# Patient Record
Sex: Male | Born: 1960 | Race: Black or African American | Hispanic: No | Smoking: Smoker, current status unknown
Health system: Southern US, Community
[De-identification: ages and names within clinical notes are randomized; demographics above are authoritative.]

## PROBLEM LIST (undated history)

## (undated) HISTORY — PX: SPINE SURGERY: SHX786

## (undated) HISTORY — PX: ORIF FINGER / THUMB FRACTURE: SUR932

---

## 2018-10-09 ENCOUNTER — Emergency Department (HOSPITAL_COMMUNITY)
Admission: EM | Admit: 2018-10-09 | Discharge: 2018-10-09 | Disposition: A | Discharge status: Home / Self Care | Payer: Self-pay | Attending: Emergency Medicine | Admitting: Emergency Medicine

## 2018-10-09 ENCOUNTER — Other Ambulatory Visit: Payer: Self-pay

## 2018-10-09 ENCOUNTER — Emergency Department (HOSPITAL_COMMUNITY): Payer: Self-pay

## 2018-10-09 ENCOUNTER — Encounter (HOSPITAL_COMMUNITY): Payer: Self-pay

## 2018-10-09 DIAGNOSIS — M545 Low back pain, unspecified: Secondary | ICD-10-CM

## 2018-10-09 DIAGNOSIS — Z79899 Other long term (current) drug therapy: Secondary | ICD-10-CM | POA: Insufficient documentation

## 2018-10-09 DIAGNOSIS — F17200 Nicotine dependence, unspecified, uncomplicated: Secondary | ICD-10-CM | POA: Insufficient documentation

## 2018-10-09 MED ORDER — METHOCARBAMOL 500 MG PO TABS: 500.0000 mg | ORAL_TABLET | Freq: Three times a day (TID) | ORAL | 0 refills | Status: AC

## 2018-10-09 MED ORDER — PREDNISONE 10 MG PO TABS: ORAL_TABLET | ORAL | 0 refills | Status: AC

## 2018-10-09 NOTE — ED Notes (Signed)
Patient transported to X-ray 

## 2018-10-09 NOTE — ED Triage Notes (Signed)
Pt reports he had spinal cord surgery one year ago and that he fell 3 days ago. Reports that he has having increasing pain to rt back that is unrelieved by otc meds.

## 2018-10-09 NOTE — Discharge Instructions (Addendum)
Alternate ice and heat to your back.  Take the medications as directed.  Follow-up with your doctor when your return home.

## 2018-10-11 NOTE — ED Provider Notes (Signed)
[facility]GENCY DEPARTMENT Provider Note   CSN: [id number] Arrival date & time: 10/09/18  1317     History   Chief Complaint Chief Complaint  Patient presents with  . Fall    HPI Collin Nelson is a 58 y.o. male.     HPI   Collin Nelson is a 58 y.o. male who presents to the Emergency Department complaining of right-sided low back pain secondary to a mechanical fall that occurred 3 days ago.  He describes a aching pain along his right lower back, buttock and hip.  Pain is associated with movement, especially walking or standing and improves at rest.  He has taken over-the-counter pain relievers with minimal to no relief.  He states that he had surgery on his cervical spine 1 year ago and states his back pain feels similar to the neck pain he had prior to surgery.  He denies pain, numbness, or weakness to his lower extremities, fever, chills, urine or bowel changes, and abdominal pain.  He states that he fell landing on his right hip and he denies head injury, LOC, dizziness or pain to his neck.    History reviewed. No pertinent past medical history.  There are no active problems to display for this patient.   Past Surgical History:  Procedure Laterality Date  . ORIF FINGER / THUMB FRACTURE Right   . SPINE SURGERY          Home Medications    Prior to Admission medications   Medication Sig Start Date End Date Taking? Authorizing Provider  methocarbamol (ROBAXIN) 500 MG tablet Take 1 tablet (500 mg total) by mouth 3 (three) times daily. 10/09/18   Tyreesha Maharaj, PA-C  predniSONE (DELTASONE) 10 MG tablet Take 6 tablets day one, 5 tablets day two, 4 tablets day three, 3 tablets day four, 2 tablets day five, then 1 tablet day six 10/09/18   Pauline Ausriplett, Ebenezer Mccaskey, PA-C    Family History History reviewed. No pertinent family history.  Social History Social History   Tobacco Use  . Smoking status: Smoker, Current Status Unknown  . Smokeless tobacco: Never Used   Substance Use Topics  . Alcohol use: Yes    Comment: occ  . Drug use: Never     Allergies   Patient has no known allergies.   Review of Systems Review of Systems  Constitutional: Negative for fever.  Eyes: Negative for visual disturbance.  Respiratory: Negative for shortness of breath.   Gastrointestinal: Negative for abdominal pain, constipation, nausea and vomiting.  Genitourinary: Negative for decreased urine volume, difficulty urinating, dysuria, flank pain and hematuria.  Musculoskeletal: Positive for back pain. Negative for joint swelling and neck pain.  Skin: Negative for rash.  Neurological: Negative for dizziness, syncope, weakness, numbness and headaches.     Physical Exam Updated Vital Signs BP 136/86 (BP Location: Right Arm)   Pulse (!) 57   Temp 98.3 F (36.8 C) (Oral)   Resp 18   Ht 5' 11.5" (1.816 m)   Wt 78.9 kg   SpO2 95%   BMI 23.93 kg/m   Physical Exam Vitals signs and nursing note reviewed.  Constitutional:      General: He is not in acute distress.    Appearance: He is well-developed. He is not ill-appearing or toxic-appearing.  HENT:     Head: Normocephalic and atraumatic.  Neck:     Musculoskeletal: Full passive range of motion without pain, normal range of motion and neck supple. No muscular tenderness.  Cardiovascular:  Rate and Rhythm: Normal rate and regular rhythm.     Pulses: Normal pulses.     Comments: DP pulses are strong and palpable bilaterally Pulmonary:     Effort: Pulmonary effort is normal.     Breath sounds: Normal breath sounds.  Abdominal:     General: There is no distension.     Palpations: Abdomen is soft.     Tenderness: There is no abdominal tenderness.  Musculoskeletal:        General: Tenderness and signs of injury present. No swelling or deformity.     Lumbar back: He exhibits tenderness and pain. He exhibits normal range of motion, no swelling, no deformity, no laceration and normal pulse.     Comments:  ttp of the right lumbar paraspinal muscles and right SI joint space.  No spinal tenderness or ecchymosis.  Pt has 5/5 strength against resistance of bilateral lower extremities.  Negative straight leg raise bilaterally, hip flexors and extensors are intact.   Skin:    General: Skin is warm and dry.     Capillary Refill: Capillary refill takes less than 2 seconds.     Findings: No rash.  Neurological:     General: No focal deficit present.     Mental Status: He is alert and oriented to person, place, and time.     Sensory: Sensation is intact. No sensory deficit.     Motor: Motor function is intact. No weakness or abnormal muscle tone.     Coordination: Coordination is intact.     Gait: Gait normal.     Deep Tendon Reflexes:     Reflex Scores:      Patellar reflexes are 2+ on the right side and 2+ on the left side.      Achilles reflexes are 2+ on the right side and 2+ on the left side.     ED Treatments / Results  Labs (all labs ordered are listed, but only abnormal results are displayed) Labs Reviewed - No data to display  EKG None  Radiology Dg Lumbar Spine Complete  Result Date: 10/09/2018 CLINICAL DATA:  Fall.  Low back and right hip pain. EXAM: LUMBAR SPINE - COMPLETE 4+ VIEW COMPARISON:  None. FINDINGS: There are 5 lumbar type vertebral bodies. The alignment is normal. There is multilevel disc space narrowing with endplate osteophytes and facet hypertrophy. The disc space narrowing is greatest at the L2-3, L3-4 and L4-5 levels. No evidence of acute fracture or pars defect. 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 Electronically Signed   By: Carey BullocksWilliam  Veazey M.D.   On: 10/09/2018 14:28   Dg Hip Unilat  With Pelvis 2-3 Views Right  Result Date: 10/09/2018 CLINICAL DATA:  Fall.  Right hip pain. EXAM: DG HIP (WITH OR WITHOUT PELVIS) 2-3V RIGHT COMPARISON:  None. FINDINGS: The mineralization and alignment are normal.  There is no evidence of acute fracture or dislocation. No evidence of femoral head avascular necrosis or significant hip arthropathy. The sacroiliac joints are intact. There is a small synovial herniation pit posteriorly in the right femoral neck. IMPRESSION: No evidence of right hip fracture or significant arthropathy. Electronically Signed   By: Carey BullocksWilliam  Veazey M.D.   On: 10/09/2018 14:27    Procedures Procedures (including critical care time)  Medications Ordered in ED Medications - No data to display   Initial Impression / Assessment and Plan / ED Course  I have reviewed the triage vital signs and the nursing notes.  Pertinent labs & imaging results that were available during my care of the patient were reviewed by me and considered in my medical decision making (see chart for details).        Patient with right-sided low back pain secondary to mechanical fall.  Neurovascularly intact.  He ambulates in the department with a slow and steady gait.  No focal neuro deficits on exam.  X-ray results are reassuring.  No concerning symptoms for cauda equina.  Patient appears appropriate for discharge home, agrees to arrange follow-up with his PCP or neurosurgeon if not improving.  Return precautions were discussed.  Final Clinical Impressions(s) / ED Diagnoses   Final diagnoses:  Acute right-sided low back pain without sciatica    ED Discharge Orders         Ordered    methocarbamol (ROBAXIN) 500 MG tablet  3 times daily     10/09/18 1610    predniSONE (DELTASONE) 10 MG tablet     10/09/18 1610           Rosaleen Mazer, [place], PA-C 10/11/18 1431    Francine Graven, DO 10/14/18 1536

## 2020-11-08 IMAGING — DX DG HIP (WITH OR WITHOUT PELVIS) 2-3V RIGHT
3 series · 3 of 3 positions shown · non-contrast
Comparison: None.

CLINICAL DATA: Fall.  Right hip pain.

EXAM:
DG HIP (WITH OR WITHOUT PELVIS) 2-3V RIGHT

[pelvis ap]
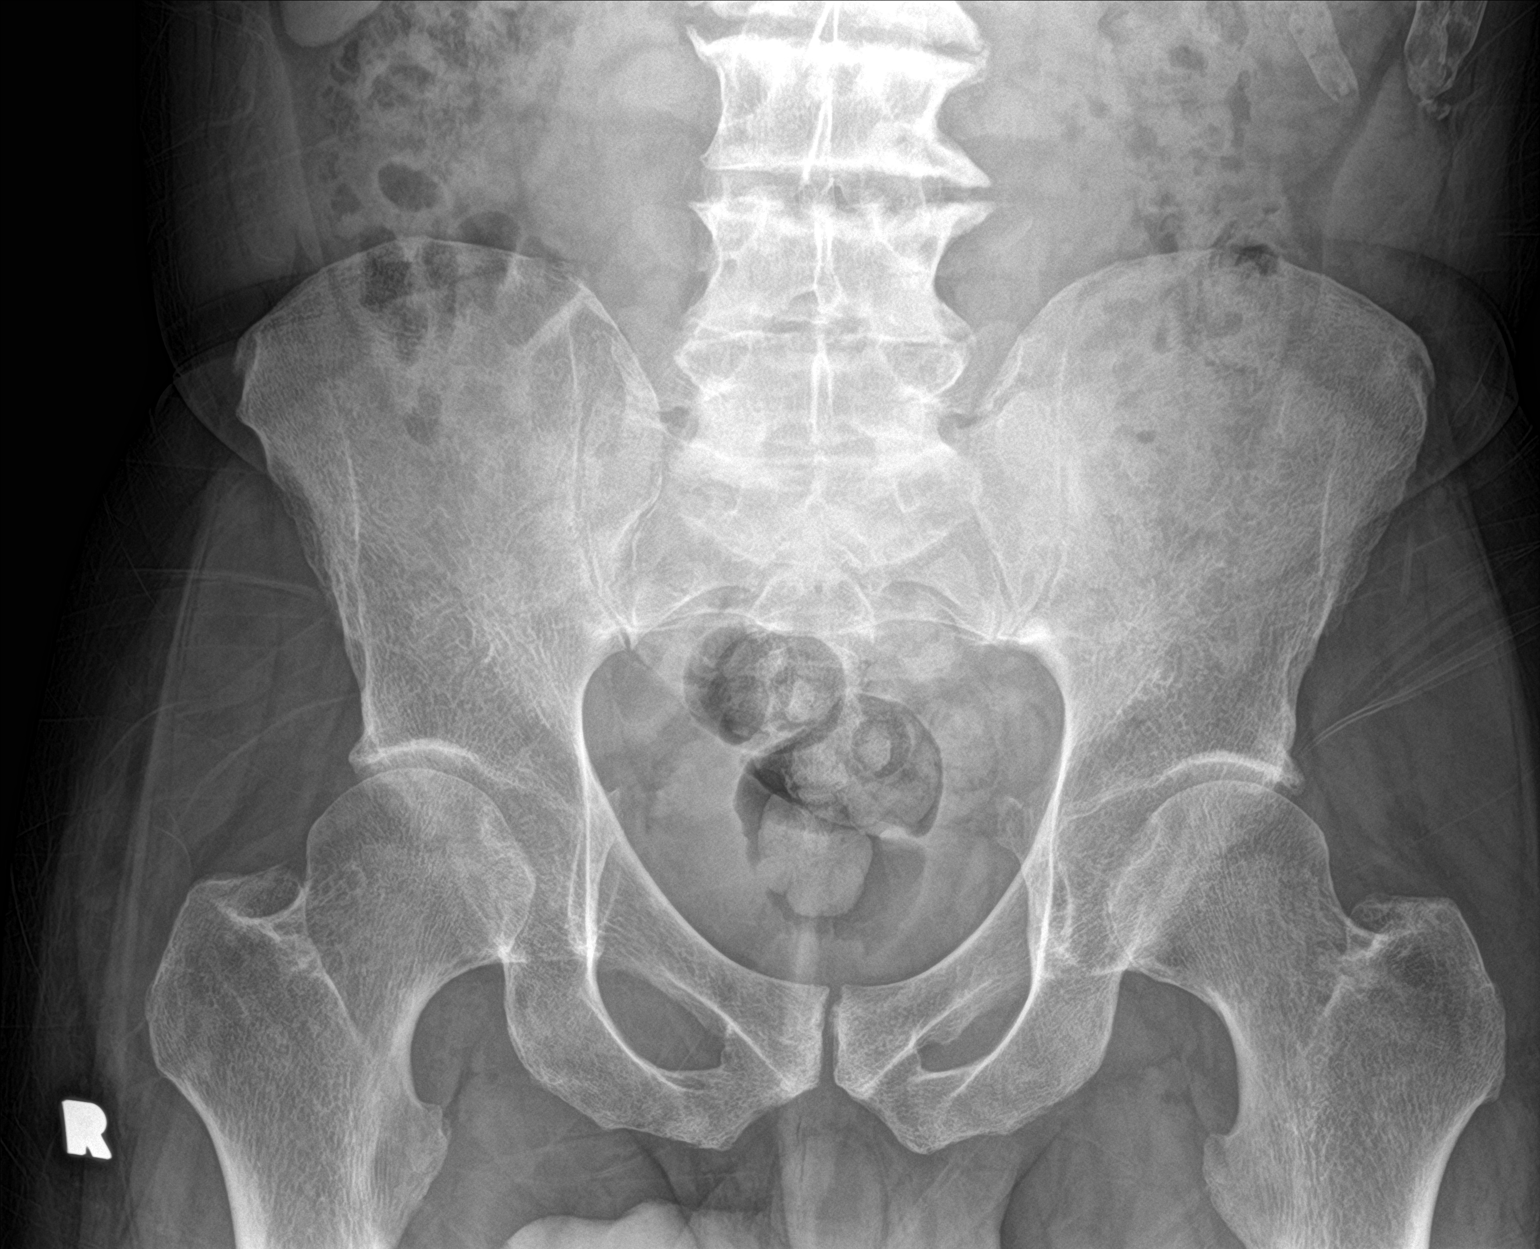

[hip ap]
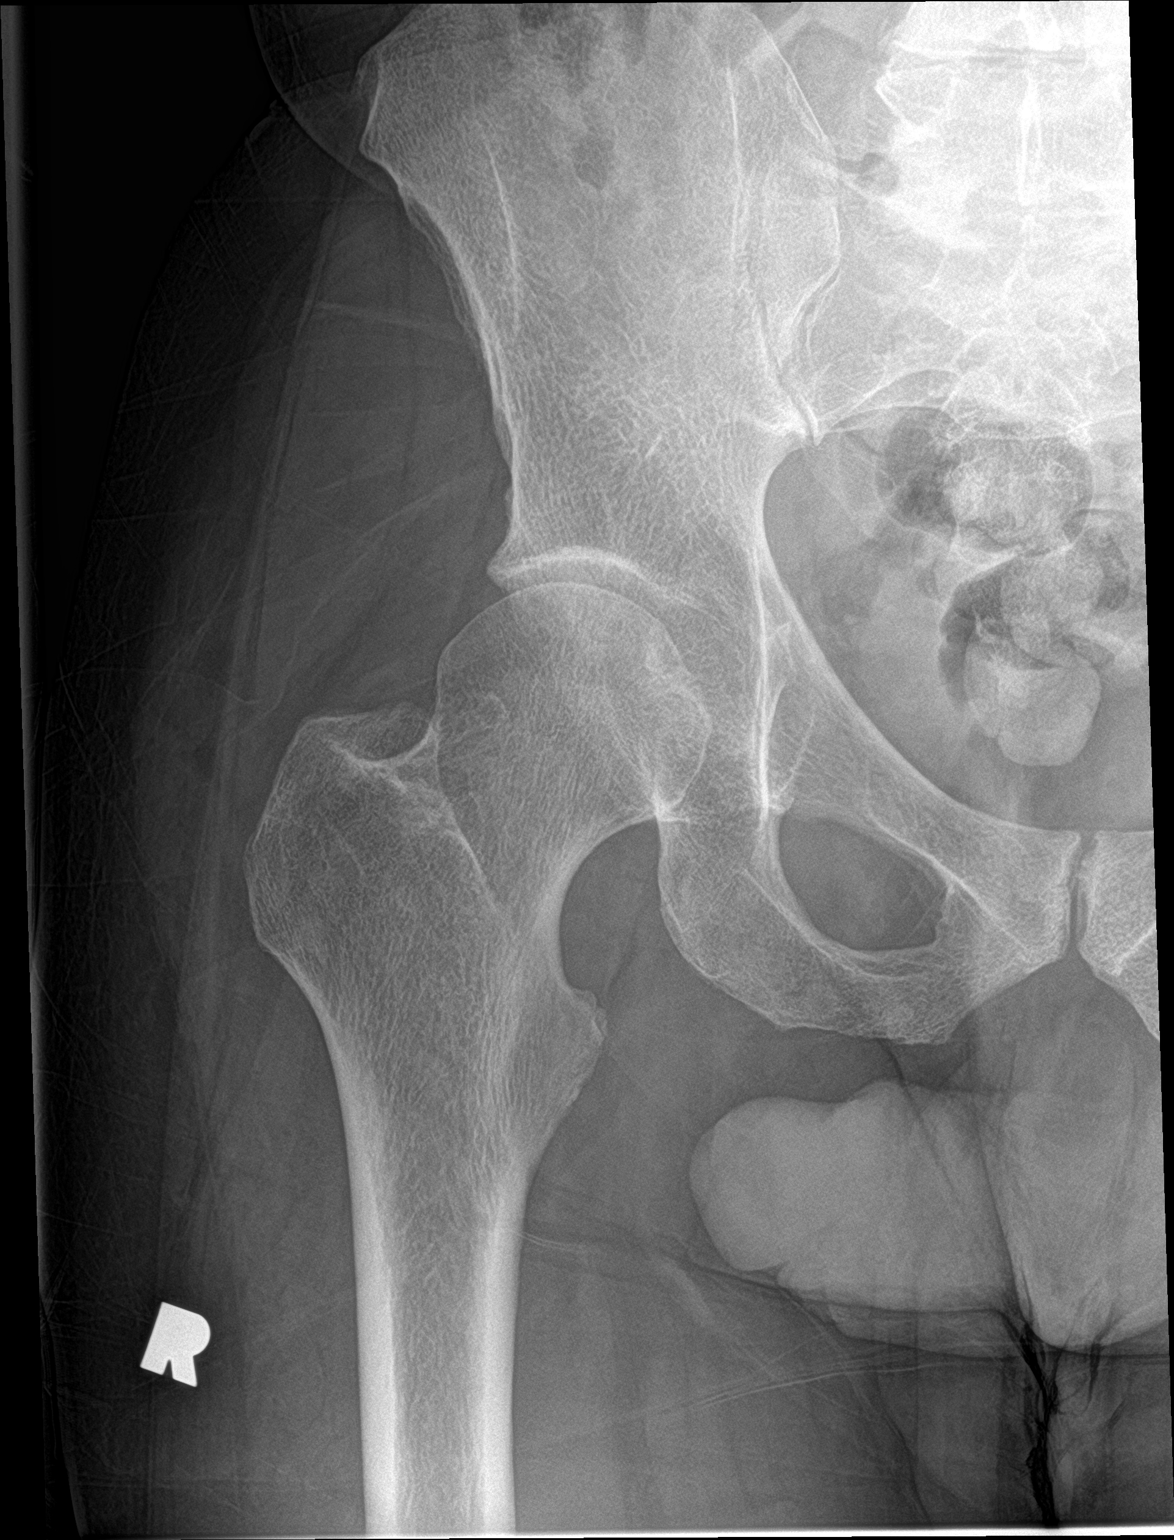

[hip lat]
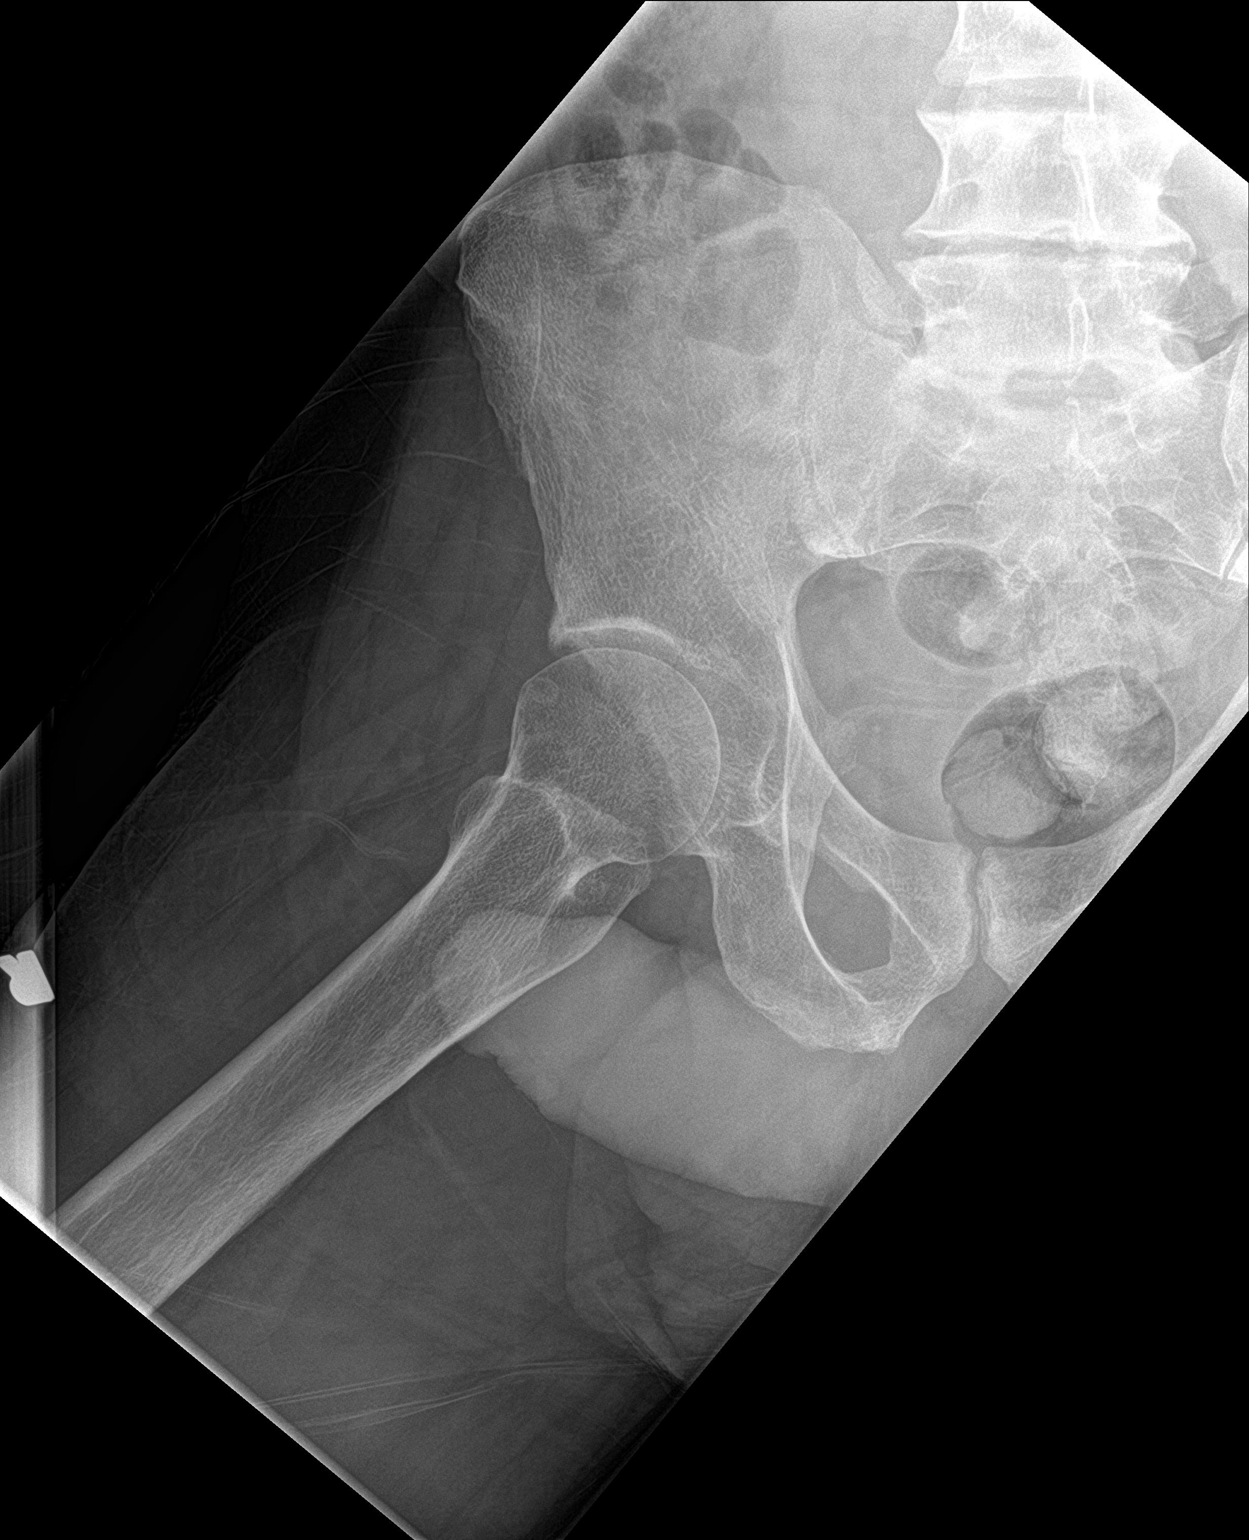

[3 of 3 positions shown; findings below may reference images not displayed]

FINDINGS: The mineralization and alignment are normal. There is no evidence of
acute fracture or dislocation. No evidence of femoral head avascular
necrosis or significant hip arthropathy. The sacroiliac joints are
intact. There is a small synovial herniation pit posteriorly in the
right femoral neck.
IMPRESSION: No evidence of right hip fracture or significant arthropathy.

## 2020-11-08 IMAGING — DX LUMBAR SPINE - COMPLETE 4+ VIEW
5 series · 5 of 5 positions shown · non-contrast
Comparison: None.

CLINICAL DATA: Fall.  Low back and right hip pain.

EXAM:
LUMBAR SPINE - COMPLETE 4+ VIEW

[l-spine ap]
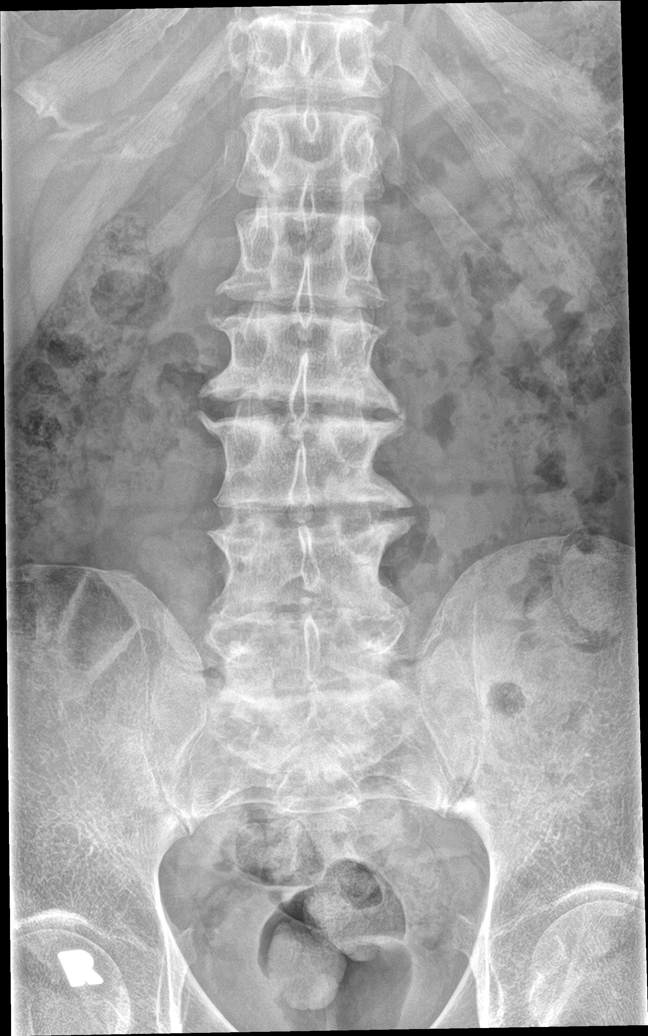

[l-spine obl (1 of 2)]
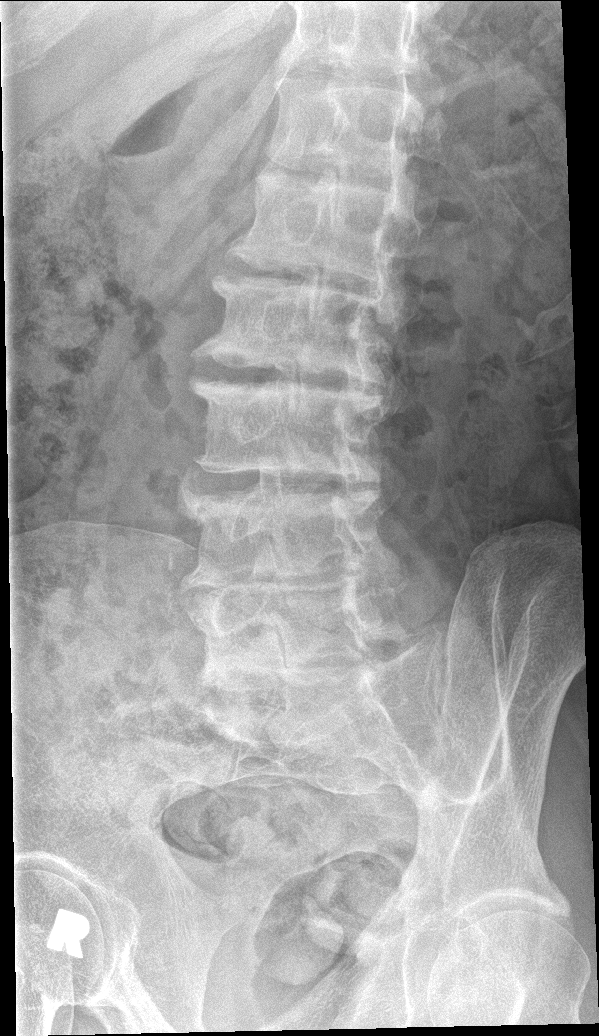

[l-spine obl (2 of 2)]
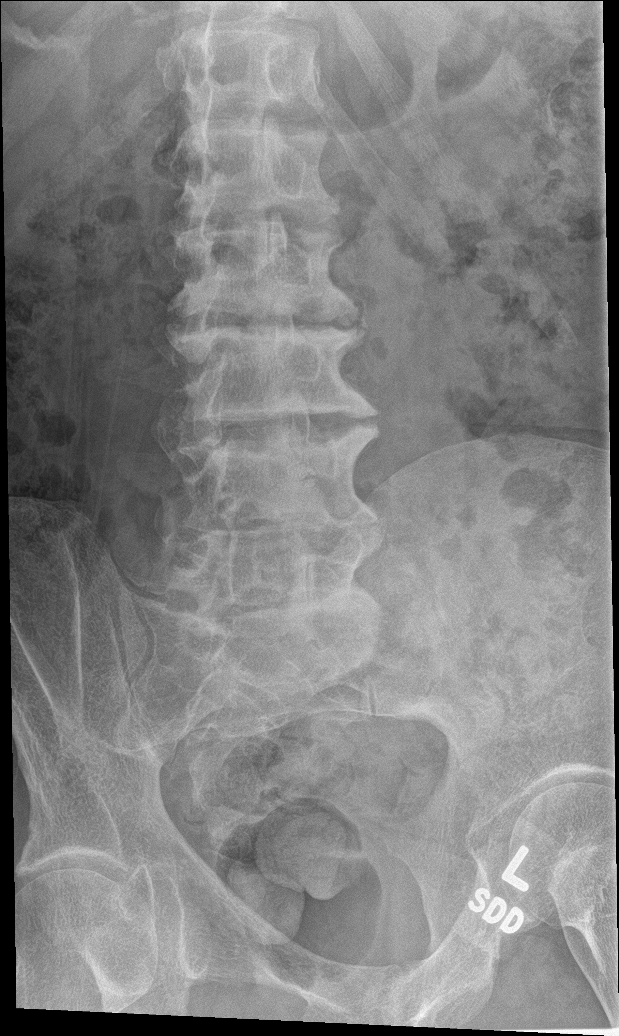

[l-spine lat]
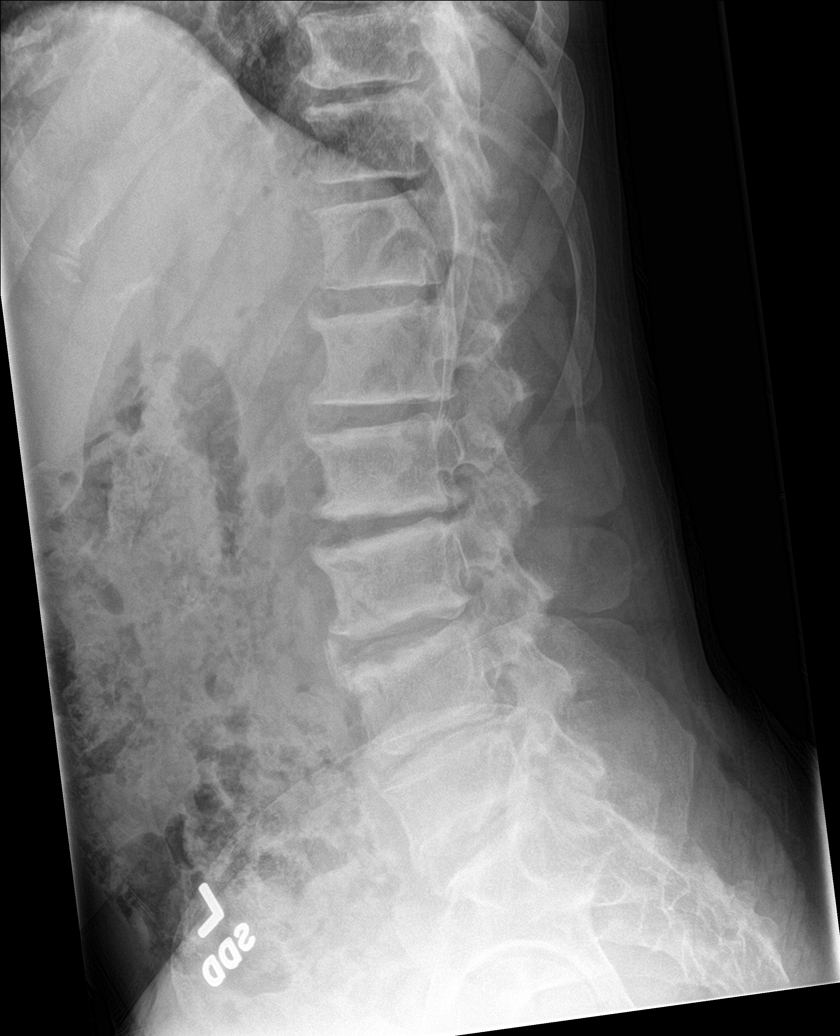

[l-spine spot]
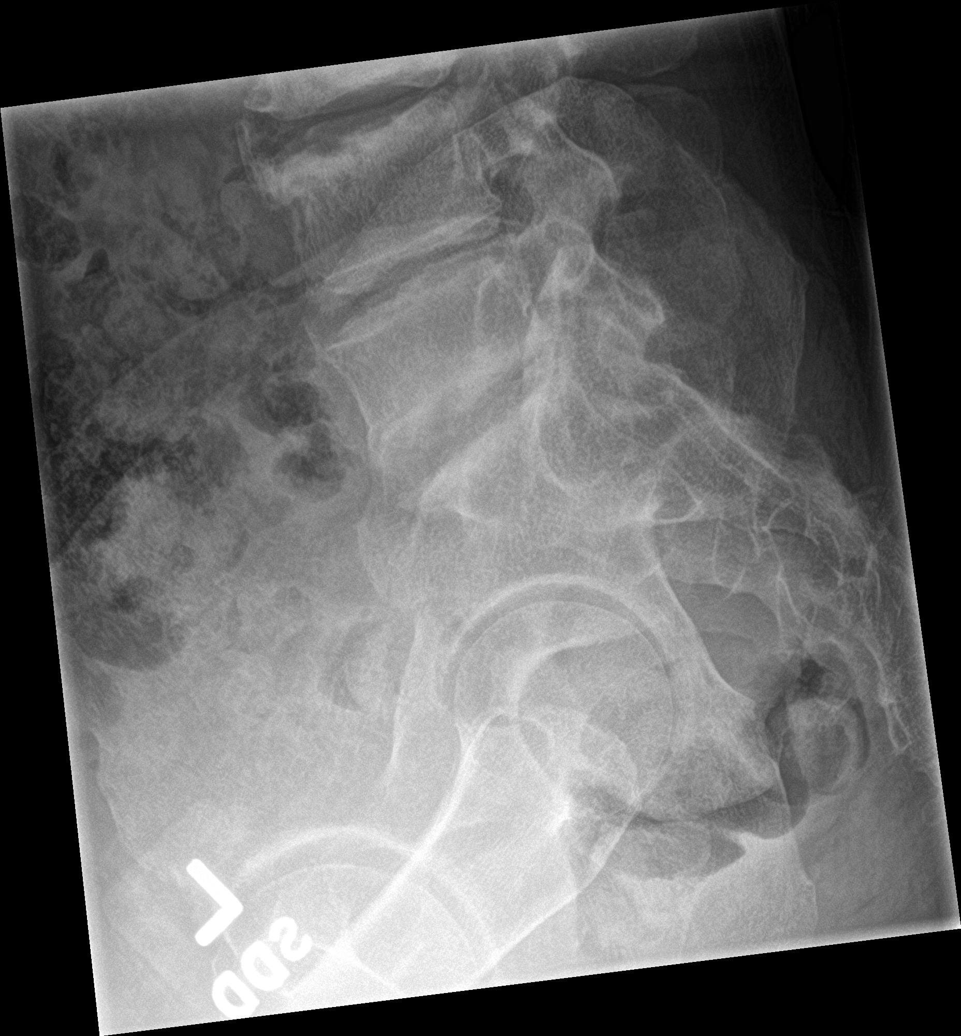

[5 of 5 positions shown; findings below may reference images not displayed]

FINDINGS: There are 5 lumbar type vertebral bodies. The alignment is normal.
There is multilevel disc space narrowing with endplate osteophytes
and facet hypertrophy. The disc space narrowing is greatest at the
L2-3, L3-4 and L4-5 levels. No evidence of acute fracture or pars
defect.
IMPRESSION: Age advanced multilevel lumbar spondylosis without acute osseous
findings or malalignment.
